# Patient Record
Sex: Male | Born: 1970 | Race: White | Hispanic: No | Marital: Single | Smoking: Current every day smoker
Health system: Southern US, Community
[De-identification: ages and names within clinical notes are randomized; demographics above are authoritative.]

## PROBLEM LIST (undated history)

## (undated) DIAGNOSIS — M109 Gout, unspecified: Secondary | ICD-10-CM

---

## 2015-05-04 ENCOUNTER — Emergency Department (HOSPITAL_COMMUNITY)
Admission: EM | Admit: 2015-05-04 | Discharge: 2015-05-04 | Disposition: A | Discharge status: Home / Self Care | Payer: Self-pay | Attending: Emergency Medicine | Admitting: Emergency Medicine

## 2015-05-04 ENCOUNTER — Encounter (HOSPITAL_COMMUNITY): Payer: Self-pay | Admitting: Emergency Medicine

## 2015-05-04 ENCOUNTER — Emergency Department (HOSPITAL_COMMUNITY): Payer: Self-pay

## 2015-05-04 DIAGNOSIS — M109 Gout, unspecified: Secondary | ICD-10-CM | POA: Insufficient documentation

## 2015-05-04 DIAGNOSIS — Z87828 Personal history of other (healed) physical injury and trauma: Secondary | ICD-10-CM | POA: Insufficient documentation

## 2015-05-04 DIAGNOSIS — M25561 Pain in right knee: Secondary | ICD-10-CM | POA: Insufficient documentation

## 2015-05-04 DIAGNOSIS — Z720 Tobacco use: Secondary | ICD-10-CM | POA: Insufficient documentation

## 2015-05-04 HISTORY — DX: Gout, unspecified: M10.9

## 2015-05-04 MED ORDER — OXYCODONE-ACETAMINOPHEN 5-325 MG PO TABS: 1.0000 | ORAL_TABLET | ORAL | Status: AC | PRN

## 2015-05-04 MED ORDER — OXYCODONE-ACETAMINOPHEN 5-325 MG PO TABS
2.0000 | ORAL_TABLET | Freq: Once | ORAL | Status: AC
  Administered 2015-05-04: 2 via ORAL
  Filled 2015-05-04: qty 2

## 2015-05-04 MED ORDER — NAPROXEN 500 MG PO TABS: 500.0000 mg | ORAL_TABLET | Freq: Two times a day (BID) | ORAL | Status: AC

## 2015-05-04 NOTE — ED Notes (Signed)
Pt made aware to return if symptoms worsen or if any life threatening symptoms occur.   

## 2015-05-04 NOTE — Discharge Instructions (Signed)

## 2015-05-04 NOTE — ED Notes (Signed)
Pain to right.  Injury to right knee one month.  Rates pain 10/10.

## 2015-05-07 NOTE — ED Provider Notes (Signed)
CSN: [id number]     Arrival date & time 05/04/15  1036 History   First MD Initiated Contact with Patient 05/04/15 1300     Chief Complaint  Patient presents with  . Knee Pain     (Consider location/radiation/quality/duration/timing/severity/associated sxs/prior Treatment) HPI   Jesse Mccarty is a 44 y.o. male who presents to the Emergency Department complaining of right knee pain for one month.  He describes a sharp, "catching pain" to his knee with bending.  He states he may have injured his knee at some point, but doesn't recall a specific incident.  also reports a similar incident with his knee a few years ago, and was told it was a "sprain" and he reports this pain is similar to that.  He has not tried any medications for his symptoms, he denies fever, chills, swelling, redness, numbness or weakness of the leg.     Past Medical History  Diagnosis Date  . Gout    History reviewed. No pertinent past surgical history. No family history on file. Social History  Substance Use Topics  . Smoking status: Current Every Day Smoker -- 1.00 packs/day    Types: Cigarettes  . Smokeless tobacco: None  . Alcohol Use: 7.2 oz/week    12 Cans of beer per week    Review of Systems  Constitutional: Negative for fever and chills.  Gastrointestinal: Negative for nausea and vomiting.  Musculoskeletal: Positive for arthralgias (right knee pain). Negative for joint swelling.  Skin: Negative for color change and wound.  Neurological: Negative for weakness and numbness.  All other systems reviewed and are negative.     Allergies  Review of patient's allergies indicates no known allergies.  Home Medications   Prior to Admission medications   Medication Sig Start Date End Date Taking? Authorizing Provider  naproxen (NAPROSYN) 500 MG tablet Take 1 tablet (500 mg total) by mouth 2 (two) times daily with a meal. 05/04/15   Tammy Triplett, PA-C  oxyCODONE-acetaminophen (PERCOCET/ROXICET) 5-325  MG per tablet Take 1 tablet by mouth every 4 (four) hours as needed. 05/04/15   Tammy Triplett, PA-C   BP 138/87 mmHg  Pulse 74  Temp(Src) 98 F (36.7 C) (Oral)  Resp 20  Ht  (1.727 m)  Wt 165 lb (74.844 kg)  BMI 25.09 kg/m2  SpO2 99% Physical Exam  Constitutional: He is oriented to person, place, and time. He appears well-developed and well-nourished. No distress.  Cardiovascular: Normal rate, regular rhythm, normal heart sounds and intact distal pulses.   Pulmonary/Chest: Effort normal and breath sounds normal. No respiratory distress.  Musculoskeletal: He exhibits tenderness. He exhibits no edema.  ttp of the anterior right knee.  Mild crepitus through ROM.  No erythema, excessive warmth, effusion, or step-off deformity.  DP pulse brisk, distal sensation intact. Calf is soft and NT.  Neurological: He is alert and oriented to person, place, and time. He exhibits normal muscle tone. Coordination normal.  Skin: Skin is warm and dry. No erythema.  Nursing note and vitals reviewed.   ED Course  Procedures (including critical care time) Labs Review Labs Reviewed - No data to display  Imaging Review Dg Knee Complete 4 Views Right  05/04/2015   CLINICAL DATA:  Woke up this morning with lateral right knee pain, no known injury  EXAM: RIGHT KNEE - COMPLETE 4+ VIEW  COMPARISON:  None.  FINDINGS: Mild narrowing of the medial compartment. No fracture or dislocation. No significant osteophyte formation. No joint effusion.  IMPRESSION: No  acute findings   Electronically Signed   By: Esperanza Heir M.D.   On: 05/04/2015 12:13    I have personally reviewed and evaluated these images and lab results as part of my medical decision-making.   EKG Interpretation None      MDM   Final diagnoses:  Right knee pain    Pt is well appearing.  Vitals stable.  No concerning sx for septic joint.  Pain with flexion.  Recurrent issue.  Pt is working here from out of town, agrees to symptomatic tx  and close f/u when he returns home in 2 days.  Knee immob applies, crutches given.      Pauline Aus, PA-C 05/07/15 1957  Alvira Monday, MD 05/10/15 1031

## 2017-02-06 IMAGING — DX DG KNEE COMPLETE 4+V*R*
4 series · 4 of 4 positions shown · non-contrast
Comparison: None.

CLINICAL DATA: Woke up this morning with lateral right knee pain,
no known injury

EXAM:
RIGHT KNEE - COMPLETE 4+ VIEW

[knee obl (1 of 2)]
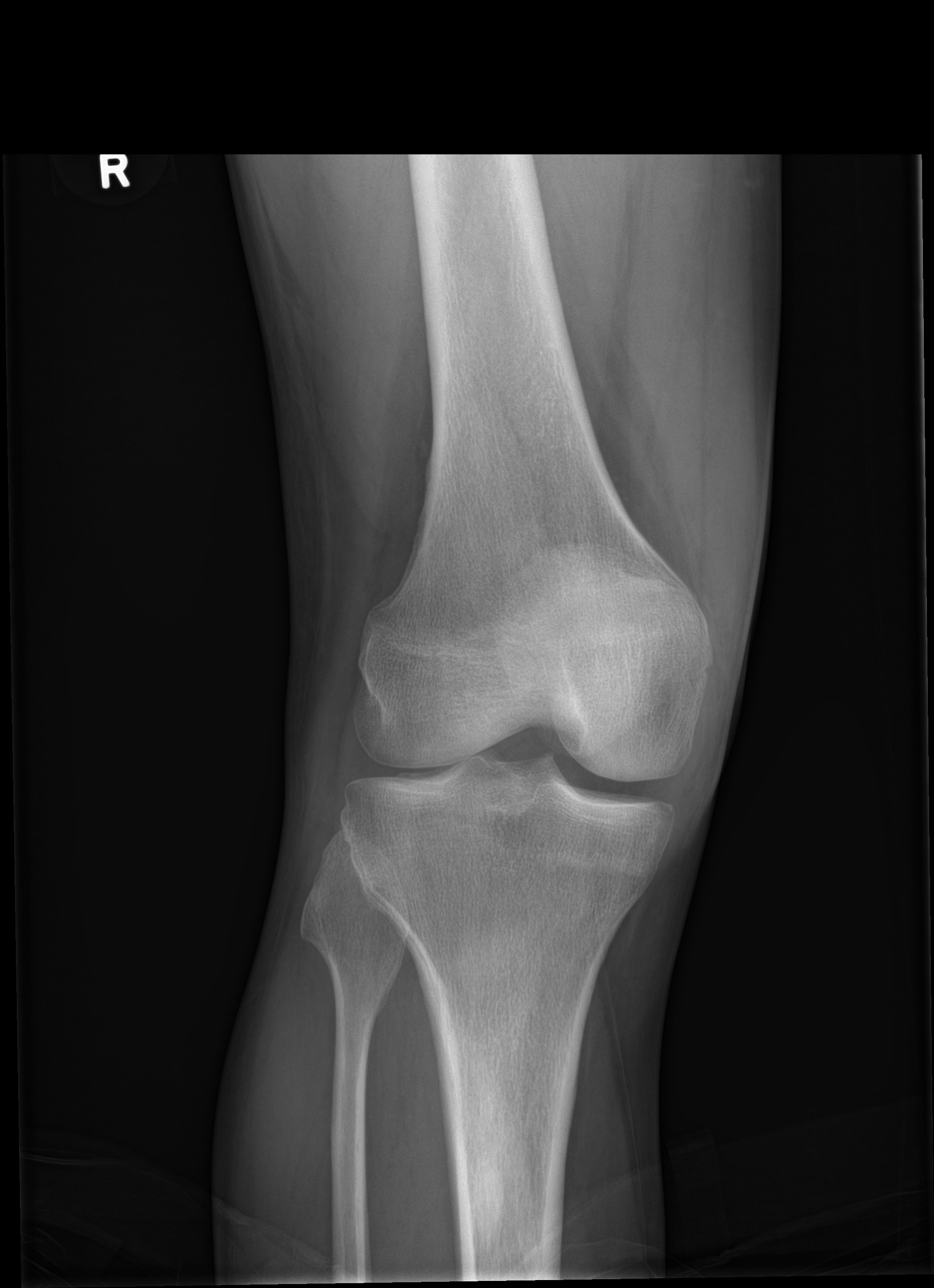

[knee obl (2 of 2)]
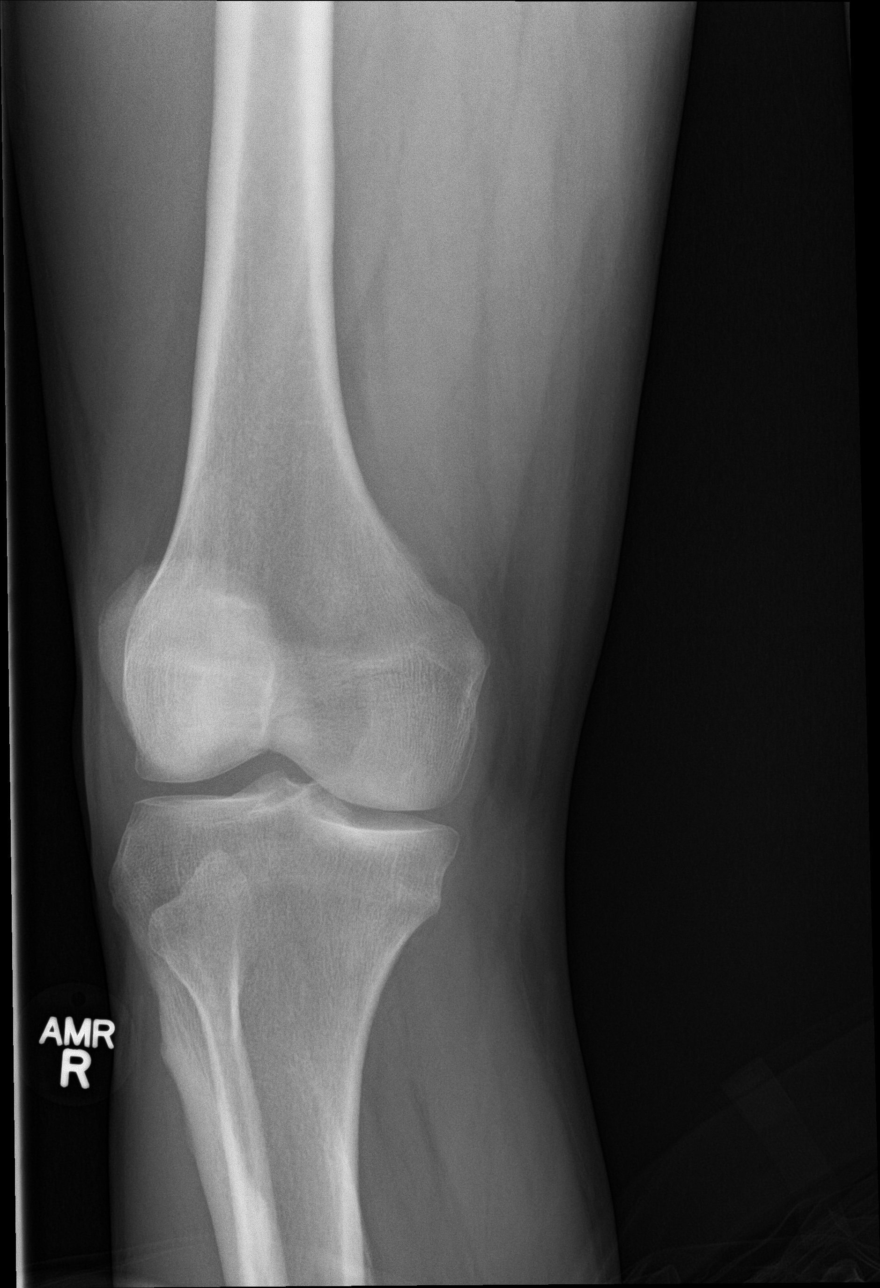

[knee lat]
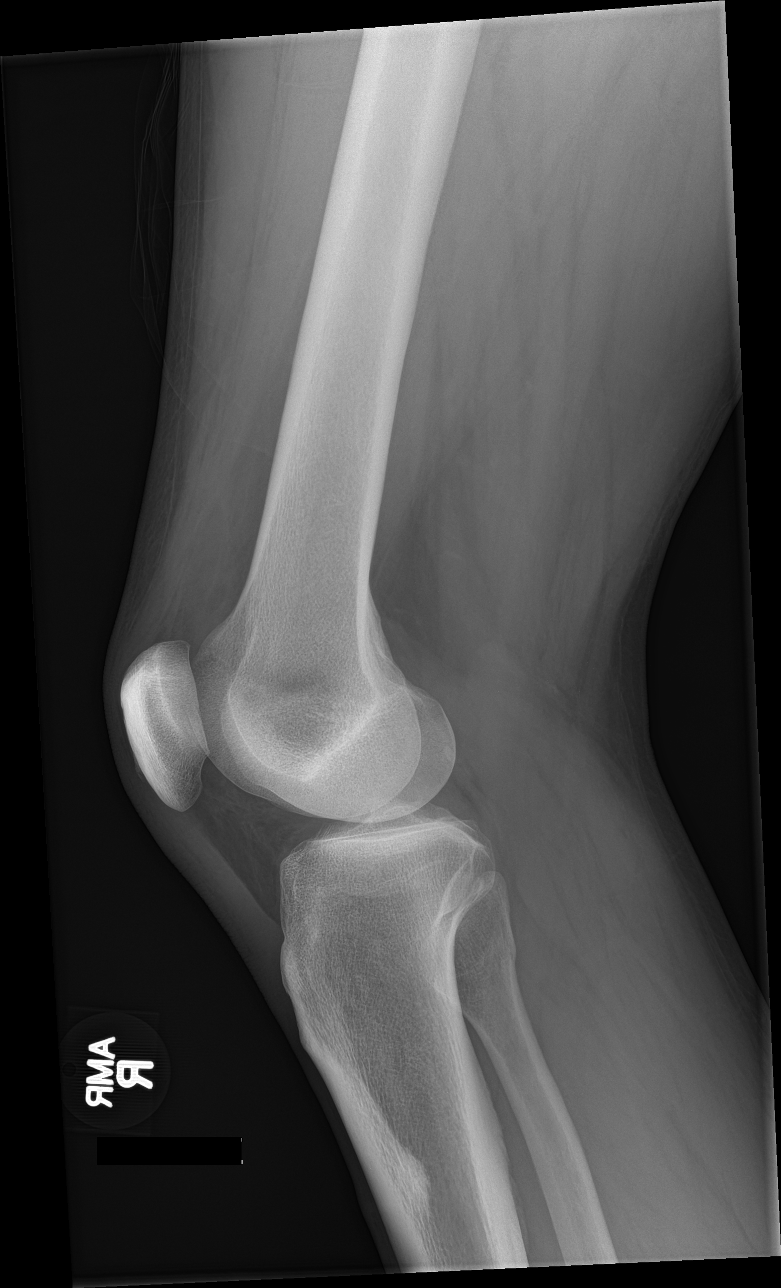

[knee ap]
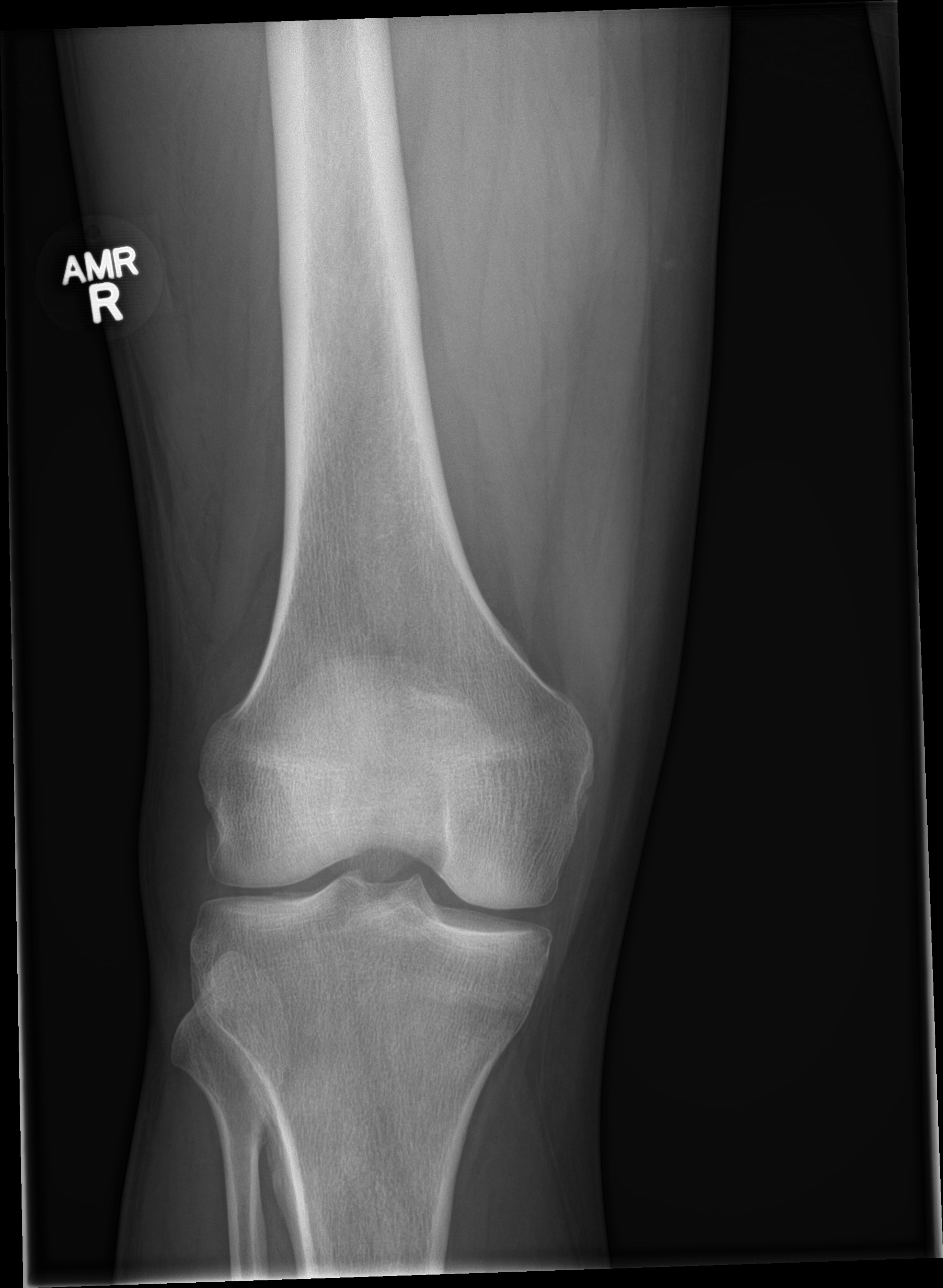

[4 of 4 positions shown; findings below may reference images not displayed]

FINDINGS: Mild narrowing of the medial compartment. No fracture or
dislocation. No significant osteophyte formation. No joint effusion.
IMPRESSION: No acute findings
